# Patient Record
Sex: Male | Born: 2000 | Race: White | Hispanic: No | Marital: Single | Smoking: Never smoker
Health system: Southern US, Community
[De-identification: ages and names within clinical notes are randomized; demographics above are authoritative.]

## PROBLEM LIST (undated history)

## (undated) DIAGNOSIS — D6851 Activated protein C resistance: Secondary | ICD-10-CM

## (undated) DIAGNOSIS — J45909 Unspecified asthma, uncomplicated: Secondary | ICD-10-CM

## (undated) HISTORY — PX: FRACTURE SURGERY: SHX138

---
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
## 2020-01-24 ENCOUNTER — Telehealth: Payer: BLUE CROSS/BLUE SHIELD | Admitting: Physician Assistant

## 2020-01-24 ENCOUNTER — Encounter: Payer: Self-pay | Admitting: Physician Assistant

## 2020-01-24 DIAGNOSIS — R110 Nausea: Secondary | ICD-10-CM

## 2020-01-24 MED ORDER — ONDANSETRON HCL 4 MG PO TABS: 4.0000 mg | ORAL_TABLET | Freq: Three times a day (TID) | ORAL | 0 refills | Status: AC | PRN

## 2020-01-24 NOTE — Progress Notes (Signed)
We are sorry that you are not feeling well. Here is how we plan to help!  Based on what you have shared with me it looks like you have a Virus that is irritating your GI tract.  Vomiting is the forceful emptying of a portion of the stomach's content through the mouth.  Although nausea and vomiting can make you feel miserable, it's important to remember that these are not diseases, but rather symptoms of an underlying illness.  When we treat short term symptoms, we always caution that any symptoms that persist should be fully evaluated in a medical office.  I have prescribed a medication that will help alleviate your symptoms and allow you to stay hydrated:  Zofran 4 mg 1 tablet every 8 hours as needed for nausea and vomiting   Riley Lane,  I recommend that you have a face to face evaluation for your symptoms to include blood work to exclude causes of your fatigue, lightheadedness, and other symptoms.   HOME CARE:  Drink clear liquids.  This is very important! Dehydration (the lack of fluid) can lead to a serious complication.  Start off with 1 tablespoon every 5 minutes for 8 hours.  You may begin eating bland foods after 8 hours without vomiting.  Start with saltine crackers, white bread, rice, mashed potatoes, applesauce.  After 48 hours on a bland diet, you may resume a normal diet.  Try to go to sleep.  Sleep often empties the stomach and relieves the need to vomit.  GET HELP RIGHT AWAY IF:   Your symptoms do not improve or worsen within 2 days after treatment.  You have a fever for over 3 days.  You cannot keep down fluids after trying the medication.  MAKE SURE YOU:   Understand these instructions.  Will watch your condition.  Will get help right away if you are not doing well or get worse.   Thank you for choosing an e-visit. Your e-visit answers were reviewed by a board certified advanced clinical practitioner to complete your personal care plan. Depending upon the  condition, your plan could have included both over the counter or prescription medications. Please review your pharmacy choice. Be sure that the pharmacy you have chosen is open so that you can pick up your prescription now.  If there is a problem you may message your provider in MyChart to have the prescription routed to another pharmacy. Your safety is important to [place]. If you have drug allergies check your prescription carefully.  For the next 24 hours, you can use MyChart to ask questions about today's visit, request a non-urgent call back, or ask for a work or school excuse from your e-visit provider. You will get an e-mail in the next two days asking about your experience. I hope that your e-visit has been valuable and will speed your recovery.   I spent 5-10 minutes on review and completion of this note- Riley Lane [facility]

## 2021-02-06 ENCOUNTER — Ambulatory Visit: Payer: Self-pay | Admitting: Orthopedic Surgery

## 2023-01-14 ENCOUNTER — Telehealth: Payer: 59 | Admitting: Family Medicine

## 2023-01-14 DIAGNOSIS — J069 Acute upper respiratory infection, unspecified: Secondary | ICD-10-CM

## 2023-01-14 MED ORDER — PSEUDOEPH-BROMPHEN-DM 30-2-10 MG/5ML PO SYRP: 5.0000 mL | ORAL_SOLUTION | Freq: Four times a day (QID) | ORAL | 0 refills | Status: AC | PRN

## 2023-01-14 MED ORDER — ALBUTEROL SULFATE HFA 108 (90 BASE) MCG/ACT IN AERS: 1.0000 | INHALATION_SPRAY | Freq: Four times a day (QID) | RESPIRATORY_TRACT | 0 refills | Status: AC | PRN

## 2023-01-14 NOTE — Progress Notes (Signed)
Virtual Visit Consent   Riley Lane, you are scheduled for a virtual visit with a [facility] provider today. Just as with appointments in the office, your consent must be obtained to participate. Your consent will be active for this visit and any virtual visit you may have with one of our providers in the next 365 days. If you have a MyChart account, a copy of this consent can be sent to you electronically.  As this is a virtual visit, video technology does not allow for your provider to perform a traditional examination. This may limit your provider's ability to fully assess your condition. If your provider identifies any concerns that need to be evaluated in person or the need to arrange testing (such as labs, EKG, etc.), we will make arrangements to do so. Although advances in technology are sophisticated, we cannot ensure that it will always work on either your end or our end. If the connection with a video visit is poor, the visit may have to be switched to a telephone visit. With either a video or telephone visit, we are not always able to ensure that we have a secure connection.  By engaging in this virtual visit, you consent to the provision of healthcare and authorize for your insurance to be billed (if applicable) for the services provided during this visit. Depending on your insurance coverage, you may receive a charge related to this service.  I need to obtain your verbal consent now. Are you willing to proceed with your visit today? Riley Lane has provided verbal consent on 01/14/2023 for a virtual visit (video or telephone). [name], NP  Date: 01/14/2023 10:28 AM  Virtual Visit via Video Note   I, [name], connected with  Riley Lane  ([id number], January 15, 2001) on 01/14/23 at 10:30 AM EDT by a video-enabled telemedicine application and verified that I am speaking with the correct person using two identifiers.  Location: Patient: Virtual Visit  Location Patient: Home Provider: Virtual Visit Location Provider: Home Office   I discussed the limitations of evaluation and management by telemedicine and the availability of in person appointments. The patient expressed understanding and agreed to proceed.    History of Present Illness: Riley Lane is a 22 y.o. who identifies as a male who was assigned male at birth, and is being seen today for sinus like symptoms.  Onset was Friday with sore throat, congestion-drainage that is yellow Associated symptoms are dry cough and sore throat with hoarseness  Modifying factors are alka seltzer cold and flu  Denies chest pain, shortness of breath, fevers, chills  History of asthma in as a child- stopped years ago with treatment Allergies grass trees and other environmental allergies Exposure to sick contacts- unknown COVID test: no  Vaccines:  none    Problems:  Patient Active Problem List   Diagnosis Date Noted   Tear of lateral meniscus of right knee 05/07/2016   Acute medial meniscus tear of right knee 04/11/2016   Contusion of bone 03/27/2016   Right knee pain 03/27/2016    Allergies:  Allergies  Allergen Reactions   Cephalosporins Hives   Penicillins Hives    Has patient had a PCN reaction causing immediate rash, facial/tongue/throat swelling, SOB or lightheadedness with hypotension: no Has patient had a PCN reaction causing severe rash involving mucus membranes or skin necrosis: no Has patient had a PCN reaction that required hospitalization: no Has patient had a PCN reaction occurring within the last  10 years: no If all of the above answers are "NO", then may proceed with Cephalosporin use.    Sulfa Antibiotics Hives   Codeine Rash    Codeine-containing cough medicine   Medications:  Current Outpatient Medications:    aspirin 325 MG tablet, Take 325 mg by mouth daily., Disp: , Rfl:    fexofenadine (ALLEGRA) 30 MG tablet, Take 30 mg by mouth 2 (two) times daily.,  Disp: , Rfl:    ibuprofen (ADVIL,MOTRIN) 200 MG tablet, Take 200 mg by mouth every 6 (six) hours as needed., Disp: , Rfl:    ondansetron (ZOFRAN) 4 MG tablet, Take 1 tablet (4 mg total) by mouth every 8 (eight) hours as needed for nausea or vomiting., Disp: 10 tablet, Rfl: 0  Observations/Objective: Patient is well-developed, well-nourished in no acute distress.  Resting comfortably  at home.  Head is normocephalic, atraumatic.  No labored breathing.  Speech is clear and coherent with logical content.  Patient is alert and oriented at baseline.  Hoarseness and dry cough noted   Assessment and Plan:  1. URI with cough and congestion  - brompheniramine-pseudoephedrine-DM 30-2-10 MG/5ML syrup; Take 5 mLs by mouth 4 (four) times daily as needed.  Dispense: 120 mL; Refill: 0 - albuterol (VENTOLIN HFA) 108 (90 Base) MCG/ACT inhaler; Inhale 1-2 puffs into the lungs every 6 (six) hours as needed for wheezing or shortness of breath.  Dispense: 8 g; Refill: 0  - Take meds as prescribed - Rest voice - Use a cool mist humidifier especially during the winter months when heat dries out the air. - Use saline nose sprays frequently to help soothe nasal passages if they are drying out. - Stay hydrated by drinking plenty of fluids   If you do not improve you will need a follow up visit in person.               Reviewed side effects, risks and benefits of medication.    Patient acknowledged agreement and understanding of the plan.   Past Medical, Surgical, Social History, Allergies, and Medications have been Reviewed.    Follow Up Instructions: I discussed the assessment and treatment plan with the patient. The patient was provided an opportunity to ask questions and all were answered. The patient agreed with the plan and demonstrated an understanding of the instructions.  A copy of instructions were sent to the patient via MyChart unless otherwise noted below.     The patient was advised to  call back or seek an in-person evaluation if the symptoms worsen or if the condition fails to improve as anticipated.  Time:  I spent 7 minutes with the patient via telehealth technology discussing the above problems/concerns.    [name], NP

## 2023-01-14 NOTE — Patient Instructions (Signed)
Riley Lane, thank you for joining [name], NP for today's virtual visit.  While this provider is not your primary care provider (PCP), if your PCP is located in our provider database this encounter information will be shared with them immediately following your visit.   A [facility] MyChart account gives you access to today's visit and all your visits, tests, and labs performed at [facility] " click here if you don't have a [facility] MyChart account or go to [email or web address]  Consent: (Patient) Riley Lane provided verbal consent for this virtual visit at the beginning of the encounter.  Current Medications:  Current Outpatient Medications:    albuterol (VENTOLIN HFA) 108 (90 Base) MCG/ACT inhaler, Inhale 1-2 puffs into the lungs every 6 (six) hours as needed for wheezing or shortness of breath., Disp: 8 g, Rfl: 0   brompheniramine-pseudoephedrine-DM 30-2-10 MG/5ML syrup, Take 5 mLs by mouth 4 (four) times daily as needed., Disp: 120 mL, Rfl: 0   aspirin 325 MG tablet, Take 325 mg by mouth daily., Disp: , Rfl:    fexofenadine (ALLEGRA) 30 MG tablet, Take 30 mg by mouth 2 (two) times daily., Disp: , Rfl:    ibuprofen (ADVIL,MOTRIN) 200 MG tablet, Take 200 mg by mouth every 6 (six) hours as needed., Disp: , Rfl:    ondansetron (ZOFRAN) 4 MG tablet, Take 1 tablet (4 mg total) by mouth every 8 (eight) hours as needed for nausea or vomiting., Disp: 10 tablet, Rfl: 0   Medications ordered in this encounter:  Meds ordered this encounter  Medications   brompheniramine-pseudoephedrine-DM 30-2-10 MG/5ML syrup    Sig: Take 5 mLs by mouth 4 (four) times daily as needed.    Dispense:  120 mL    Refill:  0    Order Specific Question:   Supervising Provider    Answer:   Merrilee Jansky [[id number]]   albuterol (VENTOLIN HFA) 108 (90 Base) MCG/ACT inhaler    Sig: Inhale 1-2 puffs into the lungs every 6 (six) hours as needed for wheezing or shortness of  breath.    Dispense:  8 g    Refill:  0    Order Specific Question:   Supervising Provider    Answer:   Merrilee Jansky [id number]     *If you need refills on other medications prior to your next appointment, please contact your pharmacy*  Follow-Up: Call back or seek an in-person evaluation if the symptoms worsen or if the condition fails to improve as anticipated.  [facility] [phone]  Other Instructions  - Take meds as prescribed - Rest voice - Use a cool mist humidifier especially during the winter months when heat dries out the air. - Use saline nose sprays frequently to help soothe nasal passages if they are drying out. - Stay hydrated by drinking plenty of fluids - Keep thermostat turn down low to prevent drying out which can cause a dry cough.   If you do not improve you will need a follow up visit in person.                  If you have been instructed to have an in-person evaluation today at a local Urgent Care facility, please use the link below. It will take you to a list of all of our available [facility], including address, phone number and hours of operation. Please do not delay care.   Urgent Cares  If  you or a family member do not have a primary care provider, use the link below to schedule a visit and establish care. When you choose a [facility] primary care physician or advanced practice provider, you gain a long-term partner in health. Find a Primary Care Provider  Learn more about [facility] in-office and virtual care options: [facility] - Get Care Now

## 2023-01-22 ENCOUNTER — Encounter (HOSPITAL_BASED_OUTPATIENT_CLINIC_OR_DEPARTMENT_OTHER): Payer: Self-pay | Admitting: Emergency Medicine

## 2023-01-22 ENCOUNTER — Emergency Department (HOSPITAL_BASED_OUTPATIENT_CLINIC_OR_DEPARTMENT_OTHER)
Admission: EM | Admit: 2023-01-22 | Discharge: 2023-01-22 | Disposition: A | Discharge status: Home / Self Care | Payer: 59 | Attending: Emergency Medicine | Admitting: Emergency Medicine

## 2023-01-22 ENCOUNTER — Other Ambulatory Visit: Payer: Self-pay

## 2023-01-22 DIAGNOSIS — F15920 Other stimulant use, unspecified with intoxication, uncomplicated: Secondary | ICD-10-CM | POA: Insufficient documentation

## 2023-01-22 DIAGNOSIS — Z7982 Long term (current) use of aspirin: Secondary | ICD-10-CM | POA: Diagnosis not present

## 2023-01-22 DIAGNOSIS — R42 Dizziness and giddiness: Secondary | ICD-10-CM | POA: Diagnosis present

## 2023-01-22 LAB — CBC WITH DIFFERENTIAL/PLATELET
Abs Immature Granulocytes: 0.03 10*3/uL (ref 0.00–0.07)
Basophils Absolute: 0 10*3/uL (ref 0.0–0.1)
Basophils Relative: 1 %
Eosinophils Absolute: 0.1 10*3/uL (ref 0.0–0.5)
Eosinophils Relative: 1 %
HCT: 42.9 % (ref 39.0–52.0)
Hemoglobin: 14.9 g/dL (ref 13.0–17.0)
Immature Granulocytes: 1 %
Lymphocytes Relative: 14 %
Lymphs Abs: 0.9 10*3/uL (ref 0.7–4.0)
MCH: 31.6 pg (ref 26.0–34.0)
MCHC: 34.7 g/dL (ref 30.0–36.0)
MCV: 91.1 fL (ref 80.0–100.0)
Monocytes Absolute: 0.3 10*3/uL (ref 0.1–1.0)
Monocytes Relative: 4 %
Neutro Abs: 5.1 10*3/uL (ref 1.7–7.7)
Neutrophils Relative %: 79 %
Platelets: 224 10*3/uL (ref 150–400)
RBC: 4.71 MIL/uL (ref 4.22–5.81)
RDW: 11.9 % (ref 11.5–15.5)
WBC: 6.5 10*3/uL (ref 4.0–10.5)
nRBC: 0 % (ref 0.0–0.2)

## 2023-01-22 LAB — BASIC METABOLIC PANEL
Anion gap: 15 (ref 5–15)
BUN: 13 mg/dL (ref 6–20)
CO2: 24 mmol/L (ref 22–32)
Calcium: 9.6 mg/dL (ref 8.9–10.3)
Chloride: 96 mmol/L — ABNORMAL LOW (ref 98–111)
Creatinine, Ser: 0.72 mg/dL (ref 0.61–1.24)
GFR, Estimated: >60 mL/min (ref >60)
Glucose, Bld: 166 mg/dL — ABNORMAL HIGH (ref 70–99)
Potassium: 3.6 mmol/L (ref 3.5–5.1)
Sodium: 135 mmol/L (ref 135–145)

## 2023-01-22 LAB — CBG MONITORING, ED
Glucose-Capillary: 146 mg/dL — ABNORMAL HIGH (ref 70–99)
Glucose-Capillary: 163 mg/dL — ABNORMAL HIGH (ref 70–99)

## 2023-01-22 MED ORDER — LACTATED RINGERS IV BOLUS
1000.0000 mL | Freq: Once | INTRAVENOUS | Status: AC
  Administered 2023-01-22: 1000 mL via INTRAVENOUS

## 2023-01-22 NOTE — Discharge Instructions (Signed)
While you are in the emergency department, you had labs done to look for problems with your electrolytes, problems with your kidneys, or signs of infection.  These tests were all normal.  Your EKG was also normal.  My suspicion is that the amount of nicotine that you are using regularly may be contributing to some of your symptoms.  I would recommend slowly trying to reduce the amount of nicotine that you use, and not using any on an empty stomach.  Follow-up with your primary care doctor.

## 2023-01-22 NOTE — ED Notes (Signed)
Pt reports he has been on the [employer] approx 4 months.

## 2023-01-22 NOTE — ED Notes (Signed)
Pt ambulated to the restroom and back to room with even steady gait, no apparent distress. Pt reports his bilat arms still feel shaky.

## 2023-01-22 NOTE — ED Triage Notes (Signed)
Pt arrives pov steady gait, c/o "feeling lightheaded" and weak today. Started after drinking coffee. Pt aox4, speech clear

## 2023-01-22 NOTE — ED Provider Notes (Signed)
[place] EMERGENCY DEPARTMENT AT [facility] Provider Note   CSN: [id number] Arrival date & time: 01/22/23  1031     History  Chief Complaint  Patient presents with   Dizziness    Riley Lane is a 22 y.o. male.  22 year old male here today because he began to feel jittery and lightheaded this morning.  No fever, no chills.  Patient has consumed 16 mg of oral nicotine, 1 cup of coffee today.  Ate breakfast following that.  This is a usual nicotine and caffeine regiment for him.   Dizziness      Home Medications Prior to Admission medications   Medication Sig Start Date End Date Taking? Authorizing Provider  albuterol (VENTOLIN HFA) 108 (90 Base) MCG/ACT inhaler Inhale 1-2 puffs into the lungs every 6 (six) hours as needed for wheezing or shortness of breath. 01/14/23   [name], NP  aspirin 325 MG tablet Take 325 mg by mouth daily.    [provider]  brompheniramine-pseudoephedrine-DM 30-2-10 MG/5ML syrup Take 5 mLs by mouth 4 (four) times daily as needed. 01/14/23   [name], NP  fexofenadine (ALLEGRA) 30 MG tablet Take 30 mg by mouth 2 (two) times daily.    [provider]  ibuprofen (ADVIL,MOTRIN) 200 MG tablet Take 200 mg by mouth every 6 (six) hours as needed.    [provider]  ondansetron (ZOFRAN) 4 MG tablet Take 1 tablet (4 mg total) by mouth every 8 (eight) hours as needed for nausea or vomiting. 01/24/20   Demetrio Lapping, PA-C      Allergies    Cephalosporins, Penicillins, Sulfa antibiotics, and Codeine    Review of Systems   Review of Systems  Neurological:  Positive for dizziness.    Physical Exam Updated Vital Signs BP (!) 149/86   Pulse 83   Temp 98.2 F (36.8 C) (Oral)   Resp 19   Wt 74.8 kg   SpO2 99%  Physical Exam Vitals reviewed.  Constitutional:      Appearance: Normal appearance.  Cardiovascular:     Rate and Rhythm: Normal rate.     Heart sounds: No murmur heard. Pulmonary:      Effort: Pulmonary effort is normal.     Breath sounds: Normal breath sounds.  Abdominal:     General: There is no distension.  Musculoskeletal:     Cervical back: Normal range of motion.  Neurological:     General: No focal deficit present.     Mental Status: He is alert.     Gait: Gait normal.     ED Results / Procedures / Treatments   Labs (all labs ordered are listed, but only abnormal results are displayed) Labs Reviewed  BASIC METABOLIC PANEL - Abnormal; Notable for the following components:      Result Value   Chloride 96 (*)    Glucose, Bld 166 (*)    All other components within normal limits  CBG MONITORING, ED - Abnormal; Notable for the following components:   Glucose-Capillary 146 (*)    All other components within normal limits  CBG MONITORING, ED - Abnormal; Notable for the following components:   Glucose-Capillary 163 (*)    All other components within normal limits  CBC WITH DIFFERENTIAL/PLATELET    EKG EKG Interpretation Date/Time:  Tuesday January 22 2023 10:46:25 EDT Ventricular Rate:  89 PR Interval:  140 QRS Duration:  97 QT Interval:  383 QTC Calculation: 466 R Axis:   78  Text Interpretation: Sinus rhythm Baseline wander in lead(s) V3 Confirmed by Anders Simmonds [phone]) on 01/22/2023 12:11:53 PM  Radiology No results found.  Procedures Procedures    Medications Ordered in ED Medications  lactated ringers bolus 1,000 mL (1,000 mLs Intravenous New Bag/Given 01/22/23 1110)    ED Course/ Medical Decision Making/ A&P                                 Medical Decision Making 22 year old male here today for feeling lightheaded and jittery.  Differential diagnoses include stimulant use, dehydration, less likely cardiogenic, less likely CVA, less likely metabolic.  Plan-patient overall looks well.  Vitals reassuring.  Will check a CBC and a BMP.  Will provide some fluids.  I suspect this is related to the nicotine and caffeine use on an empty  stomach.  Reassessment-reviewed the patient's labs, no leukocytosis, normal electrolytes, normal kidney function.  My dependent review the patient's EKG shows no ST segment depression or elevation, no T wave inversions, normal intervals.  Will discharge home.  Advised to cut back on nicotine.  Amount and/or Complexity of Data Reviewed Labs: ordered.           Final Clinical Impression(s) / ED Diagnoses Final diagnoses:  Stimulant intoxication without complication [facility])    Rx / DC Orders ED Discharge Orders     None         Anders Simmonds T, DO 01/22/23 1213

## 2023-01-22 NOTE — ED Notes (Signed)
Pt discharged in stable condition. Pt and mother expressed understanding about discharge instructions and to follow up with pcp and to return to ER for any further concerns or complications. Pt ambulated out with even steady gait, no apparent distress.

## 2023-01-22 NOTE — ED Notes (Signed)
ED Provider at bedside.
